# Patient Record
Sex: Male | Born: 2000 | Race: Black or African American | Hispanic: No | Marital: Single | Smoking: Never smoker
Health system: Southern US, Community
[De-identification: ages and names within clinical notes are randomized; demographics above are authoritative.]

---
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
## 2019-07-25 ENCOUNTER — Other Ambulatory Visit: Payer: Self-pay

## 2019-07-25 ENCOUNTER — Encounter (HOSPITAL_COMMUNITY): Payer: Self-pay | Admitting: *Deleted

## 2019-07-25 ENCOUNTER — Emergency Department (HOSPITAL_COMMUNITY)
Admission: EM | Admit: 2019-07-25 | Discharge: 2019-07-25 | Disposition: A | Discharge status: Home / Self Care | Payer: No Typology Code available for payment source | Attending: Emergency Medicine | Admitting: Emergency Medicine

## 2019-07-25 ENCOUNTER — Emergency Department (HOSPITAL_COMMUNITY): Payer: No Typology Code available for payment source

## 2019-07-25 DIAGNOSIS — Y999 Unspecified external cause status: Secondary | ICD-10-CM | POA: Insufficient documentation

## 2019-07-25 DIAGNOSIS — S161XXA Strain of muscle, fascia and tendon at neck level, initial encounter: Secondary | ICD-10-CM

## 2019-07-25 DIAGNOSIS — S32018A Other fracture of first lumbar vertebra, initial encounter for closed fracture: Secondary | ICD-10-CM | POA: Diagnosis not present

## 2019-07-25 DIAGNOSIS — Y93I9 Activity, other involving external motion: Secondary | ICD-10-CM | POA: Diagnosis not present

## 2019-07-25 DIAGNOSIS — S32009A Unspecified fracture of unspecified lumbar vertebra, initial encounter for closed fracture: Secondary | ICD-10-CM

## 2019-07-25 DIAGNOSIS — Y92410 Unspecified street and highway as the place of occurrence of the external cause: Secondary | ICD-10-CM | POA: Insufficient documentation

## 2019-07-25 DIAGNOSIS — S3992XA Unspecified injury of lower back, initial encounter: Secondary | ICD-10-CM | POA: Diagnosis present

## 2019-07-25 MED ORDER — TRAMADOL HCL 50 MG PO TABS: 50.0000 mg | ORAL_TABLET | Freq: Four times a day (QID) | ORAL | 0 refills | Status: DC | PRN

## 2019-07-25 MED ORDER — TRAMADOL HCL 50 MG PO TABS: 50.0000 mg | ORAL_TABLET | Freq: Four times a day (QID) | ORAL | 0 refills | Status: AC | PRN
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
## 2019-07-25 NOTE — Discharge Instructions (Addendum)
Ibuprofen 600 mg every 6 hours as needed for pain.  Begin taking tramadol as prescribed as needed for pain not relieved with ibuprofen.  Follow-up with your primary doctor if symptoms or not improving in the next week, and return to the ER if symptoms significantly worsen or change.
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
## 2019-07-25 NOTE — ED Notes (Signed)
Patient verbalizes understanding of discharge instructions. Opportunity for questioning and answers were provided. Armband removed by staff, pt discharged from ED. Pt. ambulatory and discharged home.  
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
